# Patient Record
Sex: Male | Born: 1970 | Race: Asian | Hispanic: No | Marital: Single | Smoking: Current every day smoker
Health system: Southern US, Community
[De-identification: ages and names within clinical notes are randomized; demographics above are authoritative.]

---

## 2003-10-29 ENCOUNTER — Observation Stay (HOSPITAL_COMMUNITY): Admission: EM | Admit: 2003-10-29 | Discharge: 2003-10-30 | Payer: Self-pay | Admitting: *Deleted

## 2007-09-13 ENCOUNTER — Emergency Department (HOSPITAL_COMMUNITY): Admission: EM | Admit: 2007-09-13 | Discharge: 2007-09-13 | Payer: Self-pay | Admitting: Emergency Medicine

## 2020-08-18 ENCOUNTER — Ambulatory Visit
Admission: EM | Admit: 2020-08-18 | Discharge: 2020-08-18 | Disposition: A | Discharge status: Home / Self Care | Payer: Commercial Managed Care - PPO | Source: Home / Self Care

## 2020-08-18 ENCOUNTER — Emergency Department (HOSPITAL_COMMUNITY)
Admission: EM | Admit: 2020-08-18 | Discharge: 2020-08-18 | Disposition: A | Discharge status: Home / Self Care | Payer: Commercial Managed Care - PPO | Attending: Emergency Medicine | Admitting: Emergency Medicine

## 2020-08-18 ENCOUNTER — Other Ambulatory Visit: Payer: Self-pay

## 2020-08-18 ENCOUNTER — Encounter (HOSPITAL_COMMUNITY): Payer: Self-pay

## 2020-08-18 ENCOUNTER — Emergency Department (HOSPITAL_COMMUNITY): Payer: Commercial Managed Care - PPO

## 2020-08-18 ENCOUNTER — Encounter: Payer: Self-pay | Admitting: Emergency Medicine

## 2020-08-18 DIAGNOSIS — R300 Dysuria: Secondary | ICD-10-CM | POA: Insufficient documentation

## 2020-08-18 DIAGNOSIS — N132 Hydronephrosis with renal and ureteral calculous obstruction: Secondary | ICD-10-CM

## 2020-08-18 DIAGNOSIS — F17210 Nicotine dependence, cigarettes, uncomplicated: Secondary | ICD-10-CM | POA: Diagnosis not present

## 2020-08-18 DIAGNOSIS — R1032 Left lower quadrant pain: Secondary | ICD-10-CM

## 2020-08-18 DIAGNOSIS — R1012 Left upper quadrant pain: Secondary | ICD-10-CM | POA: Diagnosis present

## 2020-08-18 LAB — POCT URINALYSIS DIP (MANUAL ENTRY)
Bilirubin, UA: NEGATIVE
Glucose, UA: NEGATIVE mg/dL
Ketones, POC UA: NEGATIVE mg/dL
Leukocytes, UA: NEGATIVE
Nitrite, UA: NEGATIVE
Protein Ur, POC: 30 mg/dL — AB
Spec Grav, UA: >=1.03 — AB (ref 1.010–1.025)
Urobilinogen, UA: 0.2 E.U./dL
pH, UA: 6.5 (ref 5.0–8.0)

## 2020-08-18 LAB — URINALYSIS, ROUTINE W REFLEX MICROSCOPIC
Bacteria, UA: NONE SEEN
Bilirubin Urine: NEGATIVE
Glucose, UA: NEGATIVE mg/dL
Ketones, ur: 5 mg/dL — AB
Leukocytes,Ua: NEGATIVE
Nitrite: NEGATIVE
Protein, ur: NEGATIVE mg/dL
RBC / HPF: >50 RBC/hpf — ABNORMAL HIGH (ref 0–5)
Specific Gravity, Urine: 1.019 (ref 1.005–1.030)
pH: 7 (ref 5.0–8.0)

## 2020-08-18 LAB — COMPREHENSIVE METABOLIC PANEL
ALT: 16 U/L (ref 0–44)
AST: 19 U/L (ref 15–41)
Albumin: 4.6 g/dL (ref 3.5–5.0)
Alkaline Phosphatase: 70 U/L (ref 38–126)
Anion gap: 10 (ref 5–15)
BUN: 18 mg/dL (ref 6–20)
CO2: 26 mmol/L (ref 22–32)
Calcium: 9.7 mg/dL (ref 8.9–10.3)
Chloride: 103 mmol/L (ref 98–111)
Creatinine, Ser: 0.83 mg/dL (ref 0.61–1.24)
GFR, Estimated: >60 mL/min (ref >60)
Glucose, Bld: 107 mg/dL — ABNORMAL HIGH (ref 70–99)
Potassium: 3.6 mmol/L (ref 3.5–5.1)
Sodium: 139 mmol/L (ref 135–145)
Total Bilirubin: 0.5 mg/dL (ref 0.3–1.2)
Total Protein: 8.1 g/dL (ref 6.5–8.1)

## 2020-08-18 LAB — CBC WITH DIFFERENTIAL/PLATELET
Abs Immature Granulocytes: 0.04 10*3/uL (ref 0.00–0.07)
Basophils Absolute: 0 10*3/uL (ref 0.0–0.1)
Basophils Relative: 0 %
Eosinophils Absolute: 0 10*3/uL (ref 0.0–0.5)
Eosinophils Relative: 0 %
HCT: 38.1 % — ABNORMAL LOW (ref 39.0–52.0)
Hemoglobin: 13.5 g/dL (ref 13.0–17.0)
Immature Granulocytes: 0 %
Lymphocytes Relative: 13 %
Lymphs Abs: 1.6 10*3/uL (ref 0.7–4.0)
MCH: 33.3 pg (ref 26.0–34.0)
MCHC: 35.4 g/dL (ref 30.0–36.0)
MCV: 94.1 fL (ref 80.0–100.0)
Monocytes Absolute: 0.9 10*3/uL (ref 0.1–1.0)
Monocytes Relative: 7 %
Neutro Abs: 9.6 10*3/uL — ABNORMAL HIGH (ref 1.7–7.7)
Neutrophils Relative %: 80 %
Platelets: 307 10*3/uL (ref 150–400)
RBC: 4.05 MIL/uL — ABNORMAL LOW (ref 4.22–5.81)
RDW: 13.1 % (ref 11.5–15.5)
WBC: 12.2 10*3/uL — ABNORMAL HIGH (ref 4.0–10.5)
nRBC: 0 % (ref 0.0–0.2)

## 2020-08-18 MED ORDER — KETOROLAC TROMETHAMINE 30 MG/ML IJ SOLN
30.0000 mg | Freq: Once | INTRAMUSCULAR | Status: AC
  Administered 2020-08-18: 30 mg via INTRAVENOUS
  Filled 2020-08-18: qty 1

## 2020-08-18 MED ORDER — ONDANSETRON HCL 4 MG/2ML IJ SOLN
4.0000 mg | Freq: Once | INTRAMUSCULAR | Status: AC
  Administered 2020-08-18: 4 mg via INTRAVENOUS
  Filled 2020-08-18: qty 2

## 2020-08-18 MED ORDER — TAMSULOSIN HCL 0.4 MG PO CAPS: 0.4000 mg | ORAL_CAPSULE | Freq: Every day | ORAL | 0 refills | Status: AC

## 2020-08-18 MED ORDER — MORPHINE SULFATE (PF) 4 MG/ML IV SOLN
4.0000 mg | Freq: Once | INTRAVENOUS | Status: AC
  Administered 2020-08-18: 4 mg via INTRAVENOUS
  Filled 2020-08-18: qty 1

## 2020-08-18 MED ORDER — HYDROCODONE-ACETAMINOPHEN 5-325 MG PO TABS: 1.0000 | ORAL_TABLET | Freq: Four times a day (QID) | ORAL | 0 refills | Status: AC | PRN

## 2020-08-18 MED ORDER — ONDANSETRON 8 MG PO TBDP: 8.0000 mg | ORAL_TABLET | Freq: Three times a day (TID) | ORAL | 0 refills | Status: AC | PRN

## 2020-08-18 NOTE — ED Triage Notes (Signed)
Pt here with LLQ pain since Sunday. States it feels like cramping and is progressively getting worse with nothing offering relief. Denies N/V/D

## 2020-08-18 NOTE — ED Triage Notes (Signed)
Pt [facility] abdominal pain since Sunday.

## 2020-08-18 NOTE — ED Triage Notes (Signed)
Patient c/o a tight squeezing pain of the LLQ that began 2 days ago. No pain yesterday, but had pain again this AM. Patient states nausea, but no vomiting. Patient also c/o dysuria today.

## 2020-08-18 NOTE — Discharge Instructions (Addendum)
-  I am concerned that you have an obstructed kidney stone. This can cause serious complications like infection of the kidney and even infection throughout the body. This needs to be treated urgently in the emergency room setting with imaging that we cannot perform here, and interventions that we cannot perform here. Please head straight to ED, if symptoms get worse on the day (chest pain, dizziness, severe abdominal pain)- stop and call 911 immediately.

## 2020-08-18 NOTE — ED Provider Notes (Signed)
Emergency Medicine Provider Triage Evaluation Note  Dustin Stewart , a 50 y.o. male  was evaluated in triage.  Pt complains of intermittent left lower quadrant pain for the past 2 days.  Patient states he initially had pain in the region on Sunday evening which resolved without intervention after about 2 hours.  He had no pain yesterday.  Around 11 AM today he began experiencing this pain once again.  Getting progressively worse.  No relieving factors.  Reports some mild pain with urination.  Denies nausea, vomiting, diarrhea, hematuria.  No history of kidney stones.  Physical Exam  BP 136/89 (BP Location: Right Arm)   Pulse 83   Temp 98.5 F (36.9 C) (Oral)   Resp 16   Ht 5\' 3"  (1.6 m)   Wt 44.9 kg   BMI 17.54 kg/m  Gen:   Awake, no distress   Resp:  Normal effort  MSK:   Moves extremities without difficulty  Other:  Moderate pain noted in the left lower quadrant.  No lateral abdominal pain.  No flank pain.  Medical Decision Making  Medically screening exam initiated at 5:09 PM.  Appropriate orders placed.  Dustin Stewart was informed that the remainder of the evaluation will be completed by another provider, this initial triage assessment does not replace that evaluation, and the importance of remaining in the ED until their evaluation is complete.   Winferd Humphrey, PA-C 08/18/20 1712    10/19/20, MD 08/19/20 [phone]

## 2020-08-18 NOTE — Discharge Instructions (Addendum)
Take the medications as needed for pain.  Use a urine strainer to help tell if you have passed the stone.  Follow-up with a urologist for further evaluation

## 2020-08-18 NOTE — ED Provider Notes (Signed)
[facility]    CSN: [id number] Arrival date & time: 08/18/20  1403      History   Chief Complaint Chief Complaint  Patient presents with   Abdominal Pain    HPI Dustin Stewart is a 50 y.o. male presenting with left lower quadrant pain for 3 days.  States this is colicky and getting progressively worse.  Nothing offers relief.  Denies nausea, vomiting, diarrhea, hematuria. Denies hematuria, dysuria, frequency, urgency, back pain, n/v/d/abd pain, fevers/chills, abdnormal vaginal discharge. No history nephrolithiasis.   HPI  History reviewed. No pertinent past medical history.  There are no problems to display for this patient.   History reviewed. No pertinent surgical history.     Home Medications    Prior to Admission medications   Not on File    Family History History reviewed. No pertinent family history.  Social History Social History   Tobacco Use   Smoking status: Every Day    Packs/day: 0.50    Pack years: 0.00    Types: Cigarettes   Smokeless tobacco: Never  Vaping Use   Vaping Use: Never used  Substance Use Topics   Alcohol use: Never   Drug use: Never     Allergies   Patient has no known allergies.   Review of Systems Review of Systems  Constitutional:  Negative for appetite change, chills, diaphoresis and fever.  Respiratory:  Negative for shortness of breath.   Cardiovascular:  Negative for chest pain.  Gastrointestinal:  Positive for abdominal pain. Negative for blood in stool, constipation, diarrhea, nausea and vomiting.  Genitourinary:  Negative for decreased urine volume, difficulty urinating, dysuria, flank pain, frequency, genital sores, hematuria, penile discharge, penile pain, penile swelling, scrotal swelling, testicular pain and urgency.  Musculoskeletal:  Negative for back pain.  Neurological:  Negative for dizziness, weakness and light-headedness.  All other systems reviewed and are negative.   Physical  Exam Triage Vital Signs ED Triage Vitals  Enc Vitals Group     BP 08/18/20 1542 (!) 148/76     Pulse Rate 08/18/20 1542 75     Resp 08/18/20 1542 20     Temp 08/18/20 1542 98 F (36.7 C)     Temp Source 08/18/20 1542 Oral     SpO2 08/18/20 1542 97 %     Weight 08/18/20 1543 99 lb (44.9 kg)     Height 08/18/20 1543 5\' 3"  (1.6 m)     Head Circumference --      Peak Flow --      Pain Score 08/18/20 1543 10     Pain Loc --      Pain Edu? --      Excl. in GC? --    No data found.  Updated Vital Signs BP (!) 148/76   Pulse 75   Temp 98 F (36.7 C) (Oral)   Resp 20   Ht 5\' 3"  (1.6 m)   Wt 99 lb (44.9 kg)   SpO2 97%   BMI 17.54 kg/m   Visual Acuity Right Eye Distance:   Left Eye Distance:   Bilateral Distance:    Right Eye Near:   Left Eye Near:    Bilateral Near:     Physical Exam Vitals reviewed.  Constitutional:      General: He is not in acute distress.    Appearance: Normal appearance. He is not ill-appearing.  HENT:     Head: Normocephalic and atraumatic.     Mouth/Throat:  Mouth: Mucous membranes are moist.     Comments: Moist mucous membranes Eyes:     Extraocular Movements: Extraocular movements intact.     Pupils: Pupils are equal, round, and reactive to light.  Cardiovascular:     Rate and Rhythm: Normal rate and regular rhythm.     Heart sounds: Normal heart sounds.  Pulmonary:     Effort: Pulmonary effort is normal.     Breath sounds: Normal breath sounds. No wheezing, rhonchi or rales.  Abdominal:     General: Bowel sounds are normal. There is no distension.     Palpations: Abdomen is soft. There is no mass.     Tenderness: There is abdominal tenderness in the left lower quadrant. There is no right CVA tenderness, left CVA tenderness, guarding or rebound. Negative signs include Murphy's sign, Rovsing's sign and McBurney's sign.     Comments: Pt is visibly uncomfortable at rest and during exam. Significant LLQ and L side pain to palpation. No  CVAT.  Skin:    General: Skin is warm.     Capillary Refill: Capillary refill takes less than 2 seconds.     Comments: Good skin turgor  Neurological:     General: No focal deficit present.     Mental Status: He is alert and oriented to person, place, and time.  Psychiatric:        Mood and Affect: Mood normal.        Behavior: Behavior normal.     UC Treatments / Results  Labs (all labs ordered are listed, but only abnormal results are displayed) Labs Reviewed  POCT URINALYSIS DIP (MANUAL ENTRY) - Abnormal; Notable for the following components:      Result Value   Clarity, UA cloudy (*)    Spec Grav, UA >=1.030 (*)    Blood, UA large (*)    Protein Ur, POC =30 (*)    All other components within normal limits  URINE CULTURE    EKG   Radiology No results found.  Procedures Procedures (including critical care time)  Medications Ordered in UC Medications - No data to display  Initial Impression / Assessment and Plan / UC Course  I have reviewed the triage vital signs and the nursing notes.  Pertinent labs & imaging results that were available during my care of the patient were reviewed by me and considered in my medical decision making (see chart for details).     This patient is a very pleasant 50 y.o. year old male presenting with suspected nephrolithiasis. He is visibly uncomfortable at rest. UA with large blood, negative nitrite, negative leuk. Culture sent.  Given significant pain and symptoms getting progressively worse, I suspect that he may have an obstructed kidney stone.  I am recommending he head straight to the emergency department for this evaluation and management.  Patient and family member verbalized understanding and states will head straight there.  He is hemodynamically stable for transport in personal vehicle at this time.  Translation services declined in favor of translation provided by family member.   Final Clinical Impressions(s) / UC Diagnoses    Final diagnoses:  Abdominal pain, left lower quadrant     Discharge Instructions      -I am concerned that you have an obstructed kidney stone. This can cause serious complications like infection of the kidney and even infection throughout the body. This needs to be treated urgently in the emergency room setting with imaging that we cannot perform here, and  interventions that we cannot perform here. Please head straight to ED, if symptoms get worse on the day (chest pain, dizziness, severe abdominal pain)- stop and call 911 immediately.     ED Prescriptions   None    PDMP not reviewed this encounter.   Rhys Martini, PA-C 08/18/20 1652

## 2020-08-18 NOTE — ED Provider Notes (Signed)
[facility]PT Provider Note   CSN: [id number] Arrival date & time: 08/18/20  1653     History Chief Complaint  Patient presents with   Abdominal Pain   Dysuria    Dustin Stewart is a 50 y.o. male.   Abdominal Pain Associated symptoms: dysuria   Dysuria Presenting symptoms: dysuria   Associated symptoms: abdominal pain    Patient presents to the ED for evaluation of left-sided abdominal pain.  Patient states the symptoms started a day or 2 ago.  Today however the pain has been constant and feels like a severe ache in the left side.  Patient states he has noted some pain with urination.  He also has felt chilled.  Denies any vomiting or diarrhea.  No fevers or chills.  No chest pain or shortness of breath.  Patient went to an urgent care and they did notice a large amount of blood in his urine.  He was sent to the ED for further evaluation  History reviewed. No pertinent past medical history.  There are no problems to display for this patient.   History reviewed. No pertinent surgical history.     History reviewed. No pertinent family history.  Social History   Tobacco Use   Smoking status: Every Day    Packs/day: 0.50    Pack years: 0.00    Types: Cigarettes   Smokeless tobacco: Never  Vaping Use   Vaping Use: Never used  Substance Use Topics   Alcohol use: Never   Drug use: Never    Home Medications Prior to Admission medications   Medication Sig Start Date End Date Taking? Authorizing Provider  HYDROcodone-acetaminophen (NORCO/VICODIN) 5-325 MG tablet Take 1 tablet by mouth every 6 (six) hours as needed. 08/18/20  Yes Linwood Dibbles, MD  ondansetron (ZOFRAN ODT) 8 MG disintegrating tablet Take 1 tablet (8 mg total) by mouth every 8 (eight) hours as needed for nausea or vomiting. 08/18/20  Yes Linwood Dibbles, MD  tamsulosin (FLOMAX) 0.4 MG CAPS capsule Take 1 capsule (0.4 mg total) by mouth daily. 08/18/20  Yes Linwood Dibbles, MD    Allergies     Patient has no known allergies.  Review of Systems   Review of Systems  Gastrointestinal:  Positive for abdominal pain.  Genitourinary:  Positive for dysuria.  All other systems reviewed and are negative.  Physical Exam Updated Vital Signs BP 119/83   Pulse 73   Temp 98.5 F (36.9 C) (Oral)   Resp 16   Ht 1.6 m (5\' 3" )   Wt 44.9 kg   SpO2 100%   BMI 17.54 kg/m   Physical Exam Vitals and nursing note reviewed.  Constitutional:      General: He is not in acute distress.    Appearance: He is well-developed.  HENT:     Head: Normocephalic and atraumatic.     Right Ear: External ear normal.     Left Ear: External ear normal.  Eyes:     General: No scleral icterus.       Right eye: No discharge.        Left eye: No discharge.     Conjunctiva/sclera: Conjunctivae normal.  Neck:     Trachea: No tracheal deviation.  Cardiovascular:     Rate and Rhythm: Normal rate and regular rhythm.  Pulmonary:     Effort: Pulmonary effort is normal. No respiratory distress.     Breath sounds: Normal breath sounds. No stridor. No wheezing or rales.  Abdominal:     General: Bowel sounds are normal. There is no distension.     Palpations: Abdomen is soft.     Tenderness: There is abdominal tenderness in the left upper quadrant and left lower quadrant. There is no guarding or rebound.  Musculoskeletal:        General: No tenderness or deformity.     Cervical back: Neck supple.  Skin:    General: Skin is warm and dry.     Findings: No rash.  Neurological:     General: No focal deficit present.     Mental Status: He is alert.     Cranial Nerves: No cranial nerve deficit (no facial droop, extraocular movements intact, no slurred speech).     Sensory: No sensory deficit.     Motor: No abnormal muscle tone or seizure activity.     Coordination: Coordination normal.  Psychiatric:        Mood and Affect: Mood normal.    ED Results / Procedures / Treatments   Labs (all labs ordered are  listed, but only abnormal results are displayed) Labs Reviewed  COMPREHENSIVE METABOLIC PANEL - Abnormal; Notable for the following components:      Result Value   Glucose, Bld 107 (*)    All other components within normal limits  CBC WITH DIFFERENTIAL/PLATELET - Abnormal; Notable for the following components:   WBC 12.2 (*)    RBC 4.05 (*)    HCT 38.1 (*)    Neutro Abs 9.6 (*)    All other components within normal limits  URINALYSIS, ROUTINE W REFLEX MICROSCOPIC - Abnormal; Notable for the following components:   Hgb urine dipstick MODERATE (*)    Ketones, ur 5 (*)    RBC / HPF >50 (*)    All other components within normal limits  URINE CULTURE    EKG None  Radiology CT Renal Stone Study  Result Date: 08/18/2020 CLINICAL DATA:  Flank pain kidney stone suspected. EXAM: CT ABDOMEN AND PELVIS WITHOUT CONTRAST TECHNIQUE: Multidetector CT imaging of the abdomen and pelvis was performed following the standard protocol without IV contrast. COMPARISON:  October 29, 2003 FINDINGS: Lower chest: Bronchiectasis with a few mucoid impacted bronchi, similar to prior. A few right lower lobe tree-in-bud opacities. Hepatobiliary: Unremarkable noncontrast appearance of the hepatic parenchyma. Gallbladder is unremarkable. No biliary ductal dilation. Pancreas: Unremarkable noncontrast appearance. Spleen: Within normal limits. Adrenals/Urinary Tract: Right adrenal gland is unremarkable. Hypodense 2 cm left adrenal nodule, consistent with a benign adrenal adenoma. Edematous appearance of the left kidney with hydroureteronephrosis to the level of a 4 mm stone at the ureterovesicular junction. Additional punctate nonobstructive bilateral renal stones measuring 1-2 mm. No right-sided hydronephrosis. Urinary bladder is predominantly decompressed limiting evaluation. Stomach/Bowel: Stomach is within normal limits. Appendix is not definitely visualized however there is no pericecal inflammation. No evidence of bowel  wall thickening, distention, or inflammatory changes. Vascular/Lymphatic: Scattered aortic atherosclerosis without aneurysmal dilation. No pathologically enlarged abdominal or pelvic lymph nodes. Reproductive: Prostate is unremarkable. Other: No abdominopelvic ascites.  No pneumoperitoneum. Musculoskeletal: No acute osseous abnormality. IMPRESSION: 1. Edematous appearance of the left kidney with adjacent perinephric stranding and hydroureteronephrosis to the level of a 4 mm stone at the ureterovesicular junction. Suggest correlation with laboratory values to exclude superimposed infection. 2. Additional punctate nonobstructive bilateral renal stones. 3. Bronchiectasis with a few mucoid impacted bronchi, similar to prior. A few right lower lobe tree-in-bud opacities may reflect an infectious or inflammatory bronchiolitis. 4. Benign 2 cm  left adrenal adenoma. 5.  Aortic Atherosclerosis (ICD10-I70.0). Electronically Signed   By: Maudry Mayhew MD   On: 08/18/2020 21:30    Procedures Procedures   Medications Ordered in ED Medications  morphine 4 MG/ML injection 4 mg (4 mg Intravenous Given 08/18/20 2046)  ondansetron (ZOFRAN) injection 4 mg (4 mg Intravenous Given 08/18/20 2045)  ketorolac (TORADOL) 30 MG/ML injection 30 mg (30 mg Intravenous Given 08/18/20 2210)    ED Course  I have reviewed the triage vital signs and the nursing notes.  Pertinent labs & imaging results that were available during my care of the patient were reviewed by me and considered in my medical decision making (see chart for details).  Clinical Course as of 08/18/20 2228  Tue Aug 18, 2020  2142 CT scan shows a 4 mm ureteral stone.  Urinalysis does not suggest infection [JK]  2142 White blood cell count slightly elevated.  Metabolic panel unremarkable [JK]    Clinical Course User Index [JK] Linwood Dibbles, MD   MDM Rules/Calculators/A&P                          Patient presented to the ED with complaints of flank pain.   Symptoms were concerning for the possibility of diverticulitis versus ureteral stone.  Urinalysis showed hematuria suggesting ureteral stone so Noncon CT scan was performed.  It does demonstrate a 4 mm left-sided ureteral stone.  Patient was treated with IV pain medication.  His symptoms have resolved.  Urinalysis does not suggest infection.  He is afebrile.  I doubt infected kidney stone.  Will discharge home with pain medications.  Discussed outpatient treatment and urology follow-up. Final Clinical Impression(s) / ED Diagnoses Final diagnoses:  Ureteral stone with hydronephrosis    Rx / DC Orders ED Discharge Orders          Ordered    ondansetron (ZOFRAN ODT) 8 MG disintegrating tablet  Every 8 hours PRN        08/18/20 2228    HYDROcodone-acetaminophen (NORCO/VICODIN) 5-325 MG tablet  Every 6 hours PRN        08/18/20 2228    tamsulosin (FLOMAX) 0.4 MG CAPS capsule  Daily        08/18/20 2228             Linwood Dibbles, MD 08/18/20 2229

## 2020-08-18 NOTE — ED Notes (Signed)
Daughter at bedside.

## 2020-08-18 NOTE — ED Notes (Signed)
Patient transported to CT 

## 2020-08-20 LAB — URINE CULTURE: Culture: <10000 — AB

## 2020-08-21 LAB — URINE CULTURE: Culture: 20000 — AB

## 2022-07-15 IMAGING — CT CT RENAL STONE PROTOCOL
2 of 4 series · 15 of 46 positions shown, 17 images · non-contrast
Comparison: October 29, 2003

CLINICAL DATA: Flank pain kidney stone suspected.

EXAM:
CT ABDOMEN AND PELVIS WITHOUT CONTRAST
TECHNIQUE: Multidetector CT imaging of the abdomen and pelvis was performed
following the standard protocol without IV contrast.

[Series 2: axial st · axial · 0.66mm/px · z∈[+989,+1329]mm · 12 of 78 slices shown, 14 images]
[im 5/78  soft-tissue]
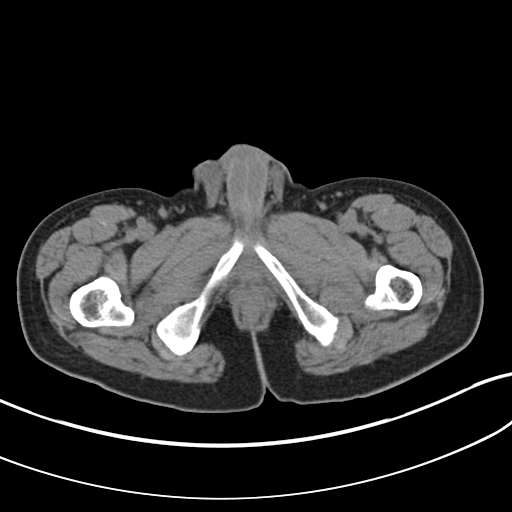
[im 5/78  bone]
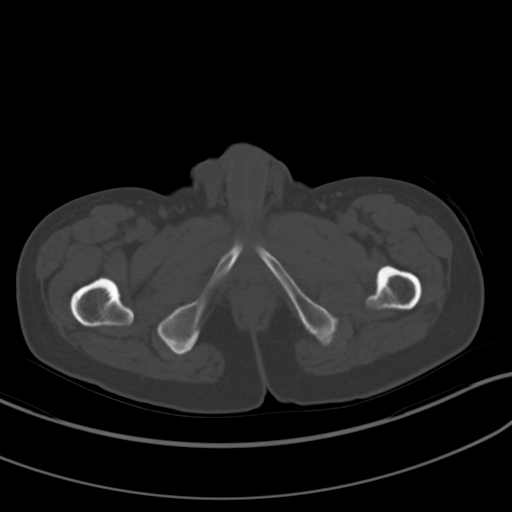
[im 13/78  soft-tissue]
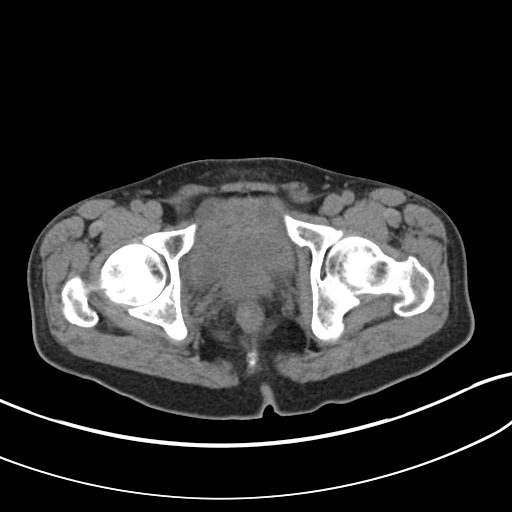
[im 18/78  soft-tissue]
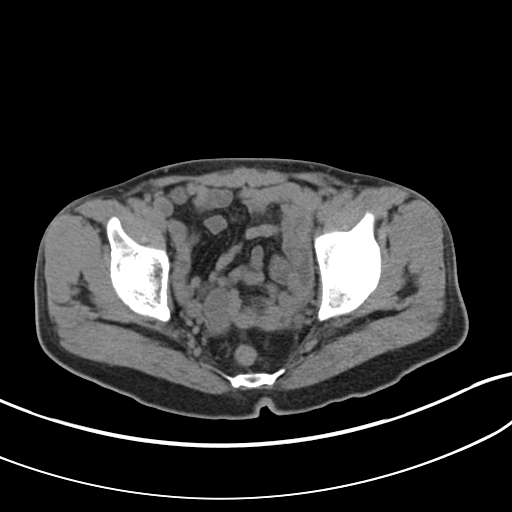
[im 22/78  soft-tissue]
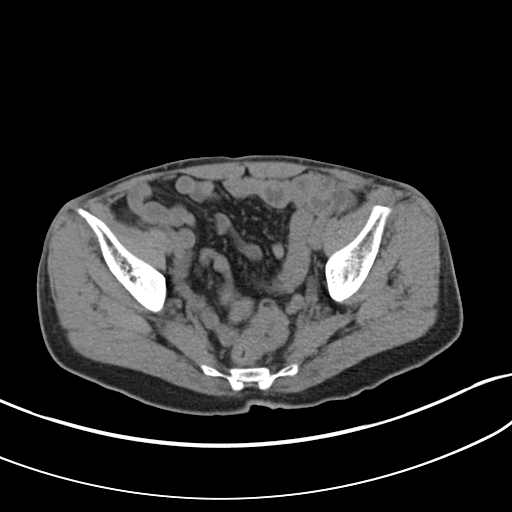
[im 30/78  soft-tissue]
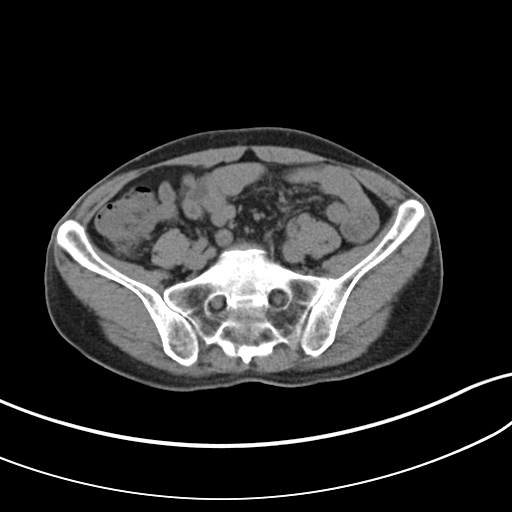
[im 35/78  soft-tissue]
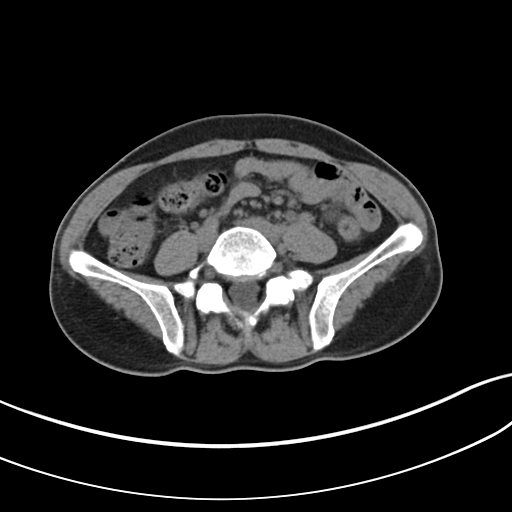
[im 43/78  soft-tissue]
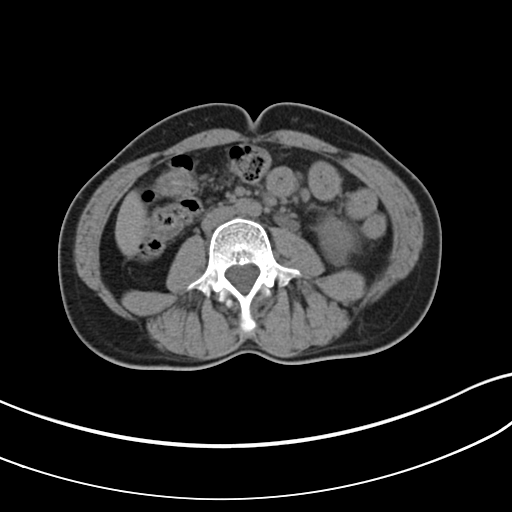
[im 48/78  soft-tissue]
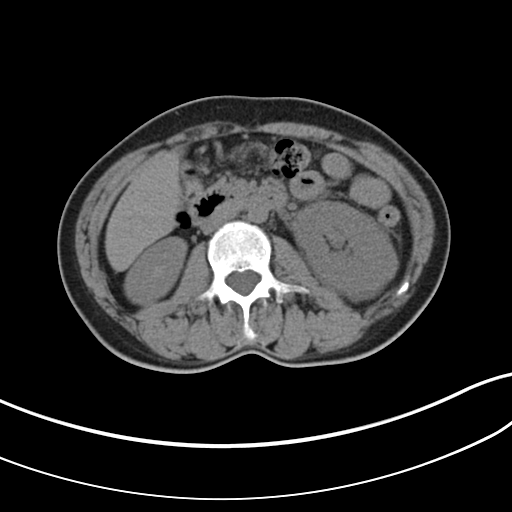
[im 56/78  soft-tissue]
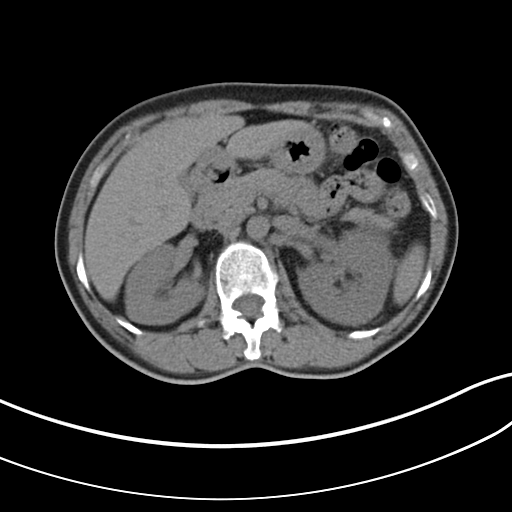
[im 56/78  bone]
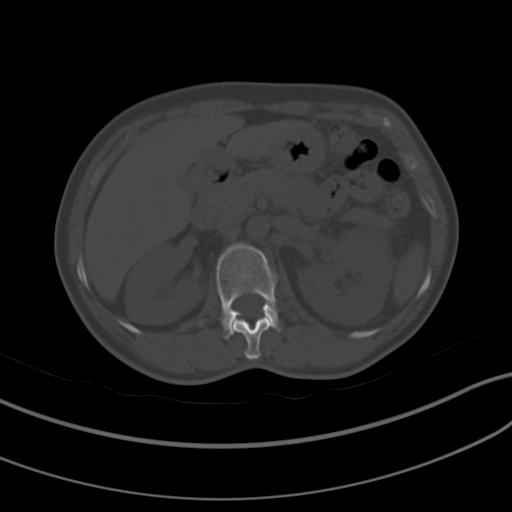
[im 60/78  soft-tissue]
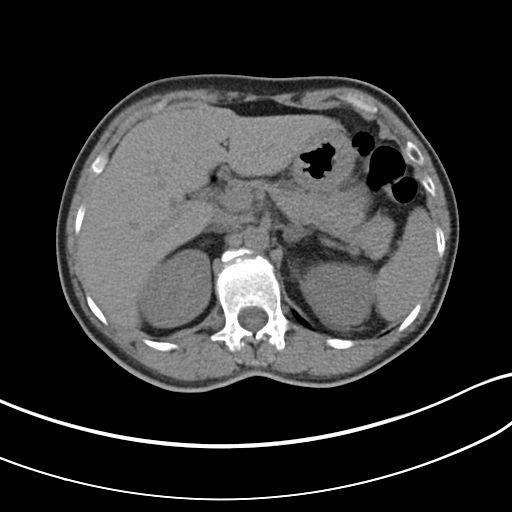
[im 65/78  soft-tissue]
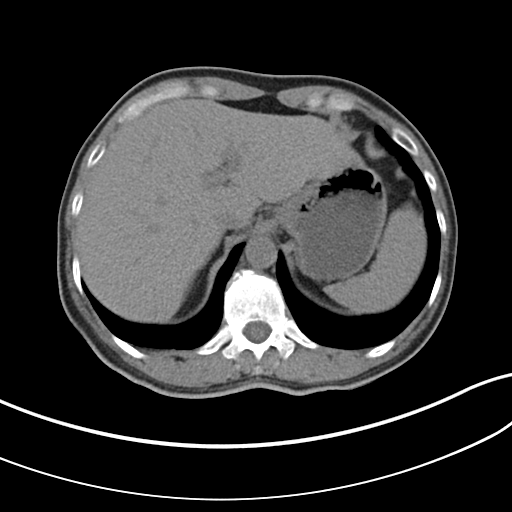
[im 73/78  soft-tissue]
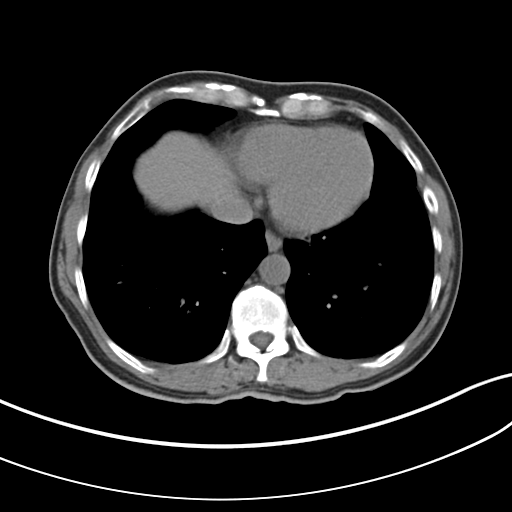

[Series 5: coronal · coronal · 0.60mm/px · 3 of 98 slices shown]
[im 33/98  soft-tissue]
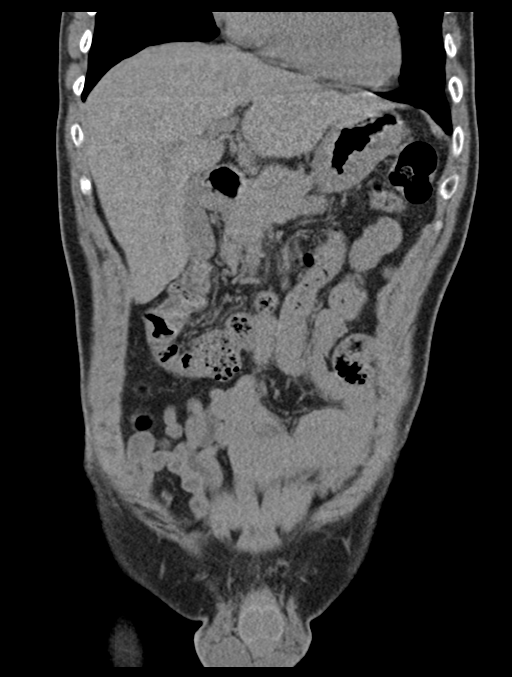
[im 44/98  soft-tissue]
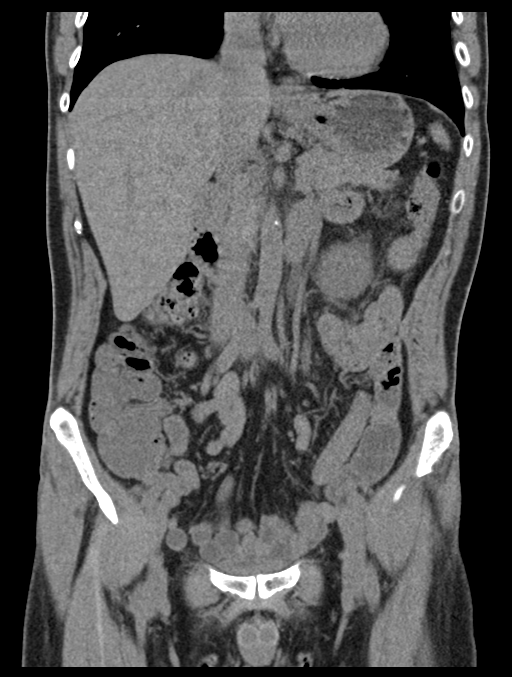
[im 54/98  soft-tissue]
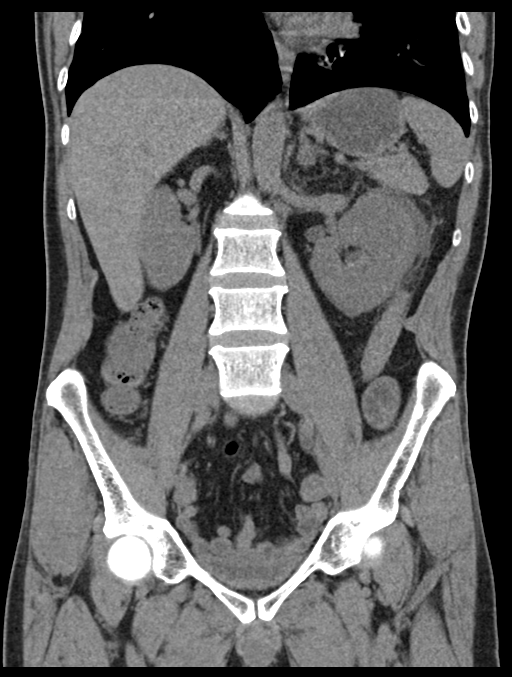

[15 of 46 positions shown; findings below may reference images not displayed]

FINDINGS: Lower chest: Bronchiectasis with a few mucoid impacted bronchi,
similar to prior. A few right lower lobe tree-in-bud opacities.

Hepatobiliary: Unremarkable noncontrast appearance of the hepatic
parenchyma. Gallbladder is unremarkable. No biliary ductal dilation.

Pancreas: Unremarkable noncontrast appearance.

Spleen: Within normal limits.

Adrenals/Urinary Tract: Right adrenal gland is unremarkable.
Hypodense 2 cm left adrenal nodule, consistent with a benign adrenal
adenoma.

Edematous appearance of the left kidney with hydroureteronephrosis
to the level of a 4 mm stone at the ureterovesicular junction.
Additional punctate nonobstructive bilateral renal stones measuring
1-2 mm. No right-sided hydronephrosis.

Urinary bladder is predominantly decompressed limiting evaluation.

Stomach/Bowel: Stomach is within normal limits. Appendix is not
definitely visualized however there is no pericecal inflammation.
No evidence of bowel wall thickening, distention, or inflammatory
changes.

Vascular/Lymphatic: Scattered aortic atherosclerosis without
aneurysmal dilation. No pathologically enlarged abdominal or pelvic
lymph nodes.

Reproductive: Prostate is unremarkable.

Other: No abdominopelvic ascites.  No pneumoperitoneum.

Musculoskeletal: No acute osseous abnormality.
IMPRESSION: 1. Edematous appearance of the left kidney with adjacent perinephric
stranding and hydroureteronephrosis to the level of a 4 mm stone at
the ureterovesicular junction. Suggest correlation with laboratory
values to exclude superimposed infection.
2. Additional punctate nonobstructive bilateral renal stones.
3. Bronchiectasis with a few mucoid impacted bronchi, similar to
prior. A few right lower lobe tree-in-bud opacities may reflect an
infectious or inflammatory bronchiolitis.
4. Benign 2 cm left adrenal adenoma.
5.  Aortic Atherosclerosis ([id number]).
